# Patient Record
Sex: Female | Born: 1965 | Race: White | Hispanic: No | Smoking: Never smoker
Health system: Southern US, Community
[De-identification: ages and names within clinical notes are randomized; demographics above are authoritative.]

## PROBLEM LIST (undated history)

## (undated) DIAGNOSIS — I447 Left bundle-branch block, unspecified: Secondary | ICD-10-CM

## (undated) DIAGNOSIS — F32A Depression, unspecified: Secondary | ICD-10-CM

## (undated) DIAGNOSIS — M1990 Unspecified osteoarthritis, unspecified site: Secondary | ICD-10-CM

## (undated) DIAGNOSIS — F329 Major depressive disorder, single episode, unspecified: Secondary | ICD-10-CM

## (undated) DIAGNOSIS — Q120 Congenital cataract: Secondary | ICD-10-CM

## (undated) DIAGNOSIS — C801 Malignant (primary) neoplasm, unspecified: Secondary | ICD-10-CM

## (undated) DIAGNOSIS — R932 Abnormal findings on diagnostic imaging of liver and biliary tract: Secondary | ICD-10-CM

## (undated) DIAGNOSIS — H409 Unspecified glaucoma: Secondary | ICD-10-CM

## (undated) DIAGNOSIS — K2970 Gastritis, unspecified, without bleeding: Secondary | ICD-10-CM

---

## 2009-02-16 ENCOUNTER — Ambulatory Visit: Payer: Self-pay | Admitting: Family Medicine

## 2011-07-14 ENCOUNTER — Ambulatory Visit: Payer: Self-pay | Admitting: Family Medicine

## 2012-09-17 DIAGNOSIS — R4586 Emotional lability: Secondary | ICD-10-CM | POA: Insufficient documentation

## 2013-06-20 HISTORY — PX: COLONOSCOPY: SHX174

## 2013-10-23 DIAGNOSIS — F32A Depression, unspecified: Secondary | ICD-10-CM | POA: Insufficient documentation

## 2013-10-23 DIAGNOSIS — F329 Major depressive disorder, single episode, unspecified: Secondary | ICD-10-CM | POA: Insufficient documentation

## 2013-10-25 ENCOUNTER — Ambulatory Visit: Payer: Self-pay | Admitting: Internal Medicine

## 2013-12-24 ENCOUNTER — Ambulatory Visit: Payer: Self-pay | Admitting: Internal Medicine

## 2014-01-16 ENCOUNTER — Ambulatory Visit: Payer: Self-pay | Admitting: Gastroenterology

## 2014-01-24 LAB — PATHOLOGY REPORT

## 2014-01-30 ENCOUNTER — Ambulatory Visit: Payer: Self-pay | Admitting: Gastroenterology

## 2014-03-04 DIAGNOSIS — I447 Left bundle-branch block, unspecified: Secondary | ICD-10-CM | POA: Insufficient documentation

## 2014-03-27 DIAGNOSIS — R932 Abnormal findings on diagnostic imaging of liver and biliary tract: Secondary | ICD-10-CM | POA: Insufficient documentation

## 2014-07-10 ENCOUNTER — Ambulatory Visit: Payer: Self-pay | Admitting: Gastroenterology

## 2014-10-23 DIAGNOSIS — Q120 Congenital cataract: Secondary | ICD-10-CM | POA: Insufficient documentation

## 2014-10-23 DIAGNOSIS — H409 Unspecified glaucoma: Secondary | ICD-10-CM | POA: Insufficient documentation

## 2014-10-23 DIAGNOSIS — M1990 Unspecified osteoarthritis, unspecified site: Secondary | ICD-10-CM | POA: Insufficient documentation

## 2014-10-24 ENCOUNTER — Encounter: Payer: Self-pay | Admitting: *Deleted

## 2014-10-27 ENCOUNTER — Ambulatory Visit: Payer: BLUE CROSS/BLUE SHIELD | Admitting: Anesthesiology

## 2014-10-27 ENCOUNTER — Ambulatory Visit
Admission: RE | Admit: 2014-10-27 | Discharge: 2014-10-27 | Disposition: A | Discharge status: Home / Self Care | Payer: BLUE CROSS/BLUE SHIELD | Source: Ambulatory Visit | Attending: Gastroenterology | Admitting: Gastroenterology

## 2014-10-27 ENCOUNTER — Encounter
Admission: RE | Disposition: A | Discharge status: Home / Self Care | Payer: Self-pay | Source: Ambulatory Visit | Attending: Gastroenterology

## 2014-10-27 ENCOUNTER — Encounter: Payer: Self-pay | Admitting: *Deleted

## 2014-10-27 DIAGNOSIS — K2950 Unspecified chronic gastritis without bleeding: Secondary | ICD-10-CM | POA: Insufficient documentation

## 2014-10-27 DIAGNOSIS — Z79899 Other long term (current) drug therapy: Secondary | ICD-10-CM | POA: Diagnosis not present

## 2014-10-27 DIAGNOSIS — M1990 Unspecified osteoarthritis, unspecified site: Secondary | ICD-10-CM | POA: Diagnosis not present

## 2014-10-27 DIAGNOSIS — H409 Unspecified glaucoma: Secondary | ICD-10-CM | POA: Diagnosis not present

## 2014-10-27 DIAGNOSIS — K3189 Other diseases of stomach and duodenum: Secondary | ICD-10-CM | POA: Diagnosis not present

## 2014-10-27 DIAGNOSIS — R1011 Right upper quadrant pain: Secondary | ICD-10-CM | POA: Diagnosis present

## 2014-10-27 DIAGNOSIS — Z791 Long term (current) use of non-steroidal anti-inflammatories (NSAID): Secondary | ICD-10-CM | POA: Diagnosis not present

## 2014-10-27 HISTORY — PX: ESOPHAGOGASTRODUODENOSCOPY: SHX5428

## 2014-10-27 HISTORY — DX: Congenital cataract: Q12.0

## 2014-10-27 HISTORY — DX: Unspecified glaucoma: H40.9

## 2014-10-27 HISTORY — DX: Left bundle-branch block, unspecified: I44.7

## 2014-10-27 HISTORY — DX: Abnormal findings on diagnostic imaging of liver and biliary tract: R93.2

## 2014-10-27 HISTORY — DX: Unspecified osteoarthritis, unspecified site: M19.90

## 2014-10-27 HISTORY — DX: Depression, unspecified: F32.A

## 2014-10-27 HISTORY — DX: Major depressive disorder, single episode, unspecified: F32.9

## 2014-10-27 LAB — POCT PREGNANCY, URINE
Preg Test, Ur: NEGATIVE
Preg Test, Ur: NEGATIVE

## 2014-10-27 SURGERY — EGD (ESOPHAGOGASTRODUODENOSCOPY)
Anesthesia: General

## 2014-10-27 MED ORDER — MIDAZOLAM HCL 5 MG/5ML IJ SOLN
INTRAMUSCULAR | Status: DC | PRN
  Administered 2014-10-27: 2 mg via INTRAVENOUS

## 2014-10-27 MED ORDER — LACTATED RINGERS IV SOLN
INTRAVENOUS | Status: DC | PRN
  Administered 2014-10-27: 09:00:00 via INTRAVENOUS

## 2014-10-27 MED ORDER — SODIUM CHLORIDE 0.9 % IV SOLN
INTRAVENOUS | Status: DC
  Administered 2014-10-27: 09:00:00 via INTRAVENOUS

## 2014-10-27 MED ORDER — PROPOFOL 10 MG/ML IV BOLUS
INTRAVENOUS | Status: DC | PRN
  Administered 2014-10-27: 50 mg via INTRAVENOUS
  Administered 2014-10-27: 100 mg via INTRAVENOUS

## 2014-10-27 NOTE — H&P (Signed)
  Primary Care Physician:  Kirk Ruths., MD  Pre-Procedure History & Physical: HPI:  Grace Burnett is a 49 y.o. female is here for an endoscopy.   Past Medical History  Diagnosis Date  . Arthritis   . Glaucoma (increased eye pressure)   . Abnormal liver diagnostic imaging   . Depression   . LBBB (left bundle branch block)   . Congenital cataract     History reviewed. No pertinent past surgical history.  Prior to Admission medications   Medication Sig Start Date End Date Taking? Authorizing Provider  ibuprofen (ADVIL,MOTRIN) 200 MG tablet Take 400 mg by mouth daily as needed for headache (pain).   Yes Historical Provider, MD  latanoprost (XALATAN) 0.005 % ophthalmic solution Place 1 drop into both eyes at bedtime.  10/28/13  Yes Historical Provider, MD  rOPINIRole (REQUIP) 0.25 MG tablet Take 0.25 mg by mouth at bedtime. Take 1-3 hours before sleep 10/13/14 10/13/15 Yes Historical Provider, MD    Allergies as of 10/10/2014  . (Not on File)    History reviewed. No pertinent family history.  History   Social History  . Marital Status: Divorced    Spouse Name: N/A  . Number of Children: N/A  . Years of Education: N/A   Occupational History  . Not on file.   Social History Main Topics  . Smoking status: Not on file  . Smokeless tobacco: Not on file  . Alcohol Use: Not on file  . Drug Use: Not on file  . Sexual Activity: Not on file   Other Topics Concern  . Not on file   Social History Narrative     Physical Exam: BP 149/64 mmHg  Pulse 66  Temp(Src) 98.2 F (36.8 C) (Oral)  Resp 16  SpO2 100%  LMP  General:   Alert,  pleasant and cooperative in NAD Head:  Normocephalic and atraumatic. Neck:  Supple; no masses or thyromegaly. Lungs:  Clear throughout to auscultation.    Heart:  Regular rate and rhythm. Abdomen:  Soft, nontender and nondistended. Normal bowel sounds, without guarding, and without rebound.   Neurologic:  Alert and  oriented x4;   grossly normal neurologically.  Impression/Plan: Grace Burnett is here for an endoscopy to be performed for abdominal pain, possible portal HTN, r/o varices.   Risks, benefits, limitations, and alternatives regarding  endoscopy have been reviewed with the patient.  Questions have been answered.  All parties agreeable.   Josefine Class, MD  10/27/2014, 9:14 AM

## 2014-10-27 NOTE — Anesthesia Postprocedure Evaluation (Signed)
  Anesthesia Post-op Note  Patient: Grace Burnett  Procedure(s) Performed: Procedure(s): ESOPHAGOGASTRODUODENOSCOPY (EGD) (N/A)  Anesthesia type:General  Patient location: PACU  Post pain: Pain level controlled  Post assessment: Post-op Vital signs reviewed, Patient's Cardiovascular Status Stable, Respiratory Function Stable, Patent Airway and No signs of Nausea or vomiting  Post vital signs: Reviewed and stable  Last Vitals:  Filed Vitals:   10/27/14 0854  BP: 149/64  Pulse: 66  Temp: 36.8 C  Resp: 16    Level of consciousness: awake, alert  and patient cooperative  Complications: No apparent anesthesia complications

## 2014-10-27 NOTE — Op Note (Signed)
[facility] Patient Name: Grace Burnett Procedure Date: 10/27/2014 8:44 AM MRN: [id number] Account #: [id number] Date of Birth: 09-17-1965 Admit Type: Outpatient Age: 49 Room: [facility] ENDO ROOM 2 Gender: Female Note Status: Finalized Procedure:         Upper GI endoscopy Indications:       Abdominal pain in the right upper quadrant, possible                     portal hypertension: rule out esophageal varices Patient Profile:   This is a 49 year old female. Providers:         Gerrit Heck. Rayann Heman, MD Referring MD:      Ocie Cornfield. Ouida Sills, MD (Referring MD) Medicines:         Propofol per Anesthesia Complications:     No immediate complications. Procedure:         Pre-Anesthesia Assessment:                    - Prior to the procedure, a History and Physical was                     performed, and patient medications and allergies were                     reviewed. The patient is competent. The risks and benefits                     of the procedure and the sedation options and risks were                     discussed with the patient. All questions were answered                     and informed consent was obtained. Patient identification                     and proposed procedure were verified by the physician and                     the nurse in the pre-procedure area. Mental Status                     Examination: alert and oriented. Airway Examination:                     normal oropharyngeal airway and neck mobility. Respiratory                     Examination: clear to auscultation. CV Examination: RRR,                     no murmurs, no S3 or S4. Prophylactic Antibiotics: The                     patient does not require prophylactic antibiotics. Prior                     Anticoagulants: The patient has taken no previous                     anticoagulant or antiplatelet agents. ASA Grade                     Assessment: III - A patient  with severe  systemic disease.                     After reviewing the risks and benefits, the patient was                     deemed in satisfactory condition to undergo the procedure.                     The anesthesia plan was to use monitored anesthesia care                     (MAC). Immediately prior to administration of medications,                     the patient was re-assessed for adequacy to receive                     sedatives. The heart rate, respiratory rate, oxygen                     saturations, blood pressure, adequacy of pulmonary                     ventilation, and response to care were monitored                     throughout the procedure. The physical status of the                     patient was re-assessed after the procedure.                    - Prior to the procedure, a History and Physical was                     performed, and patient medications, allergies and                     sensitivities were reviewed. The patient's tolerance of                     previous anesthesia was reviewed.                    - Prior to the procedure, a History and Physical was                     performed, and patient medications, allergies and                     sensitivities were reviewed. The patient's tolerance of                     previous anesthesia was reviewed.                    After obtaining informed consent, the endoscope was passed                     under direct vision. Throughout the procedure, the                     patient's blood pressure, pulse, and oxygen saturations                     were monitored continuously. The Endoscope was introduced  through the mouth, and advanced to the second part of                     duodenum. The upper GI endoscopy was accomplished without                     difficulty. The patient tolerated the procedure well. Findings:      The esophagus was normal.      The stomach was normal.      The examined duodenum  was normal.      Four biopsies were obtained with cold forceps for histology randomly in       the gastric body and in the gastric antrum. Four biopsies were obtained       with cold forceps for histology randomly in the duodenal bulb and in the       2nd part of the duodenum. Impression:        - Normal esophagus.                    - Normal stomach.                    - Normal examined duodenum.                    - Four biopsies were obtained in the gastric body and in                     the gastric antrum.                    - Four biopsies were obtained in the duodenal bulb and in                     the 2nd part of the duodenum. Recommendation:    - Observe patient in GI recovery unit.                    - Resume regular diet.                    - Continue present medications.                    - Await pathology results.                    - Return to GI clinic.                    - This is likely abdominal migraine type pain. Consider                     starting SSRI or TCA.                    - The findings and recommendations were discussed with the                     patient.                    - The findings and recommendations were discussed with the                     patient's family. Procedure Code(s): --- Professional ---  54982, Esophagogastroduodenoscopy, flexible, transoral;                     with biopsy, single or multiple CPT copyright 2014 American Medical Association. All rights reserved. The codes documented in this report are preliminary and upon coder review may  be revised to meet current compliance requirements. Mellody Life, MD 10/27/2014 9:39:20 AM This report has been signed electronically. Number of Addenda: 0 Note Initiated On: 10/27/2014 8:44 AM Total Procedure Duration: 0 hours 7 minutes 10 seconds       [facility]

## 2014-10-27 NOTE — Anesthesia Preprocedure Evaluation (Signed)
Anesthesia Evaluation  Patient identified by MRN, date of birth, ID band Patient awake    Reviewed: Allergy & Precautions, H&P , NPO status , Patient's Chart, lab work & pertinent test results, reviewed documented beta blocker date and time   Airway Mallampati: II  TM Distance: >3 FB Neck ROM: full    Dental no notable dental hx.    Pulmonary neg pulmonary ROS,  breath sounds clear to auscultation  Pulmonary exam normal       Cardiovascular Exercise Tolerance: Good negative cardio ROS  Rhythm:regular Rate:Normal     Neuro/Psych negative neurological ROS  negative psych ROS   GI/Hepatic negative GI ROS, Neg liver ROS,   Endo/Other  negative endocrine ROS  Renal/GU negative Renal ROS  negative genitourinary   Musculoskeletal   Abdominal   Peds  Hematology negative hematology ROS (+)   Anesthesia Other Findings   Reproductive/Obstetrics negative OB ROS                             Anesthesia Physical Anesthesia Plan  ASA: II  Anesthesia Plan: General   Post-op Pain Management:    Induction:   Airway Management Planned:   Additional Equipment:   Intra-op Plan:   Post-operative Plan:   Informed Consent: I have reviewed the patients History and Physical, chart, labs and discussed the procedure including the risks, benefits and alternatives for the proposed anesthesia with the patient or authorized representative who has indicated his/her understanding and acceptance.   Dental Advisory Given  Plan Discussed with: CRNA  Anesthesia Plan Comments:         Anesthesia Quick Evaluation

## 2014-10-27 NOTE — Discharge Instructions (Signed)

## 2014-10-27 NOTE — Transfer of Care (Signed)
Immediate Anesthesia Transfer of Care Note  Patient: Grace Burnett  Procedure(s) Performed: Procedure(s): ESOPHAGOGASTRODUODENOSCOPY (EGD) (N/A)  Patient Location: PACU  Anesthesia Type:General  Level of Consciousness: alert  and oriented  Airway & Oxygen Therapy: Patient Spontanous Breathing  Post-op Assessment: Report given to RN  Post vital signs: Reviewed and stable  Last Vitals:  Filed Vitals:   10/27/14 0854  BP: 149/64  Pulse: 66  Temp: 36.8 C  Resp: 16    Complications: No apparent anesthesia complications

## 2014-10-29 ENCOUNTER — Encounter: Payer: Self-pay | Admitting: Gastroenterology

## 2014-10-29 LAB — SURGICAL PATHOLOGY

## 2015-01-06 ENCOUNTER — Other Ambulatory Visit: Payer: Self-pay | Admitting: Internal Medicine

## 2015-01-06 DIAGNOSIS — N6452 Nipple discharge: Secondary | ICD-10-CM

## 2015-01-06 DIAGNOSIS — N644 Mastodynia: Secondary | ICD-10-CM

## 2015-01-09 ENCOUNTER — Encounter: Payer: Self-pay | Admitting: *Deleted

## 2015-01-20 ENCOUNTER — Ambulatory Visit
Admission: RE | Admit: 2015-01-20 | Discharge: 2015-01-20 | Disposition: A | Discharge status: Home / Self Care | Payer: BLUE CROSS/BLUE SHIELD | Source: Ambulatory Visit | Attending: Internal Medicine | Admitting: Internal Medicine

## 2015-01-20 ENCOUNTER — Ambulatory Visit: Payer: BLUE CROSS/BLUE SHIELD

## 2015-01-20 ENCOUNTER — Other Ambulatory Visit: Payer: Self-pay | Admitting: Internal Medicine

## 2015-01-20 DIAGNOSIS — N6452 Nipple discharge: Secondary | ICD-10-CM

## 2015-01-20 DIAGNOSIS — N644 Mastodynia: Secondary | ICD-10-CM

## 2015-01-20 HISTORY — DX: Malignant (primary) neoplasm, unspecified: C80.1

## 2015-01-29 ENCOUNTER — Ambulatory Visit (INDEPENDENT_AMBULATORY_CARE_PROVIDER_SITE_OTHER): Payer: BLUE CROSS/BLUE SHIELD | Admitting: General Surgery

## 2015-01-29 ENCOUNTER — Encounter: Payer: Self-pay | Admitting: General Surgery

## 2015-01-29 VITALS — BP 122/74 | HR 76 | Resp 12 | Ht 59.0 in | Wt 124.0 lb

## 2015-01-29 DIAGNOSIS — N6452 Nipple discharge: Secondary | ICD-10-CM | POA: Diagnosis not present

## 2015-01-29 NOTE — Progress Notes (Signed)
Patient ID: Grace Burnett, female   DOB: [birth year], 49 y.o.   MRN: [id number]  Chief Complaint  Patient presents with  . Other    bloody discharge     HPI Grace Burnett is a 49 y.o. female who presents for a breast evaluation. The most recent mammogram was done on 8/2/016.  Patient does perform regular self breast checks and gets regular mammograms done.  Patient states she noticed some bloody discharge from her right nipple about first part of June. She has not noticed any discharge since that one time. Denies any breast injury. She states since the nipple discharge the right breast has been "sore".  The patient denies any trauma to the area. She does work in a [employer], and his right arm dominant area  She had seen Dr Rayann Heman for right upper abdominal pain in the past.  The patient is a mother of 28, twin girls 44, and a 42 and 47 year old.  HPI  Past Medical History  Diagnosis Date  . Arthritis   . Glaucoma (increased eye pressure)   . Abnormal liver diagnostic imaging   . Depression   . LBBB (left bundle branch block)   . Congenital cataract   . Cancer     skin    Past Surgical History  Procedure Laterality Date  . Esophagogastroduodenoscopy N/A 10/27/2014    Procedure: ESOPHAGOGASTRODUODENOSCOPY (EGD);  Surgeon: Josefine Class, MD;  [facility] ENDOSCOPY;  Service: Endoscopy;  Laterality: N/A;  . Colonoscopy  2015    Dr Rayann Heman    Family History  Problem Relation Age of Onset  . Breast cancer      great aunt age 41-40?    Social History Social History  Substance Use Topics  . Smoking status: Never Smoker   . Smokeless tobacco: Never Used  . Alcohol Use: No    Allergies  Allergen Reactions  . Sulfa Antibiotics Nausea And Vomiting and Other (See Comments)    Severe vomiting, possibly caused fever also    Current Outpatient Prescriptions  Medication Sig Dispense Refill  . ibuprofen (ADVIL,MOTRIN) 200 MG tablet Take 400 mg by mouth daily  as needed for headache (pain).    Marland Kitchen latanoprost (XALATAN) 0.005 % ophthalmic solution Place 1 drop into both eyes at bedtime.     Marland Kitchen rOPINIRole (REQUIP) 0.25 MG tablet Take 0.25 mg by mouth at bedtime. Take 1-3 hours before sleep     No current facility-administered medications for this visit.    Review of Systems Review of Systems  Constitutional: Negative.   Respiratory: Negative.   Cardiovascular: Negative.     Blood pressure 122/74, pulse 76, resp. rate 12, height 4\' 11"  (1.499 m), weight 124 lb (56.246 kg), last menstrual period 01/01/2015.  Physical Exam Physical Exam  Constitutional: She is oriented to person, place, and time. She appears well-developed and well-nourished.  HENT:  Mouth/Throat: Oropharynx is clear and moist.  Eyes: Conjunctivae are normal. No scleral icterus.  Neck: Neck supple.  Cardiovascular: Normal rate, regular rhythm and normal heart sounds.   Pulmonary/Chest: Effort normal and breath sounds normal. Right breast exhibits no inverted nipple, no mass, no nipple discharge, no skin change and no tenderness. Left breast exhibits no inverted nipple, no mass, no nipple discharge, no skin change and no tenderness.    Lymphadenopathy:    She has no cervical adenopathy.    She has no axillary adenopathy.  Neurological: She is alert and oriented to person, place, and time.  Skin: Skin is warm and dry.  Psychiatric: Her behavior is normal.    Data Reviewed Bilateral mammograms and right breast ultrasound of 01/20/2015 reviewed. Nipple drainage was not able to be identified at that time. Small cyst in the lateral aspect of the retroareolar area of the right breast were reported. BI-RADS-2.  Independent review the films shows a small focal density 2.8 mm in diameter superficially located in the retroareolar area at the 3:00 position. This is smoothly marginated.  Assessment    Isolated episode of bloody nipple drainage.    Plan    The mammograms except  for the small less than 3 mm area in the retroareolar area of the medial right breasts are unremarkable compared 2015. The diffuse right-sided breast tenderness is is likely to be from the underlying pectoralis muscle as the breast itself.  The patient has been encouraged to call this office directly should she develop a recurrent episode drainage. If that occurs, we'll repeat her ultrasound and determine if weeks section of the retroareolar ductal structures is necessary. If she remains asymptomatic in regards to nipple drainage annual screening mammograms with her PCP are reasonable.     Follow up as needed or if breast symptoms occur. Continue self breast exams. Call office for any new breast issues or concerns.  PCP:  Sol Passer 01/29/2015, 11:57 AM

## 2015-01-29 NOTE — Patient Instructions (Addendum)
Follow up as needed ir if breast symptoms occur Continue self breast exams. Call office for any new breast issues or concerns.

## 2016-02-17 ENCOUNTER — Other Ambulatory Visit: Payer: Self-pay | Admitting: Internal Medicine

## 2016-02-17 DIAGNOSIS — Z1231 Encounter for screening mammogram for malignant neoplasm of breast: Secondary | ICD-10-CM

## 2016-03-04 ENCOUNTER — Other Ambulatory Visit: Payer: Self-pay | Admitting: Internal Medicine

## 2016-03-04 ENCOUNTER — Ambulatory Visit
Admission: RE | Admit: 2016-03-04 | Discharge: 2016-03-04 | Disposition: A | Discharge status: Home / Self Care | Payer: BLUE CROSS/BLUE SHIELD | Source: Ambulatory Visit | Attending: Internal Medicine | Admitting: Internal Medicine

## 2016-03-04 DIAGNOSIS — Z1231 Encounter for screening mammogram for malignant neoplasm of breast: Secondary | ICD-10-CM | POA: Insufficient documentation

## 2016-04-25 ENCOUNTER — Encounter: Payer: Self-pay | Admitting: Emergency Medicine

## 2016-04-25 ENCOUNTER — Emergency Department
Admission: EM | Admit: 2016-04-25 | Discharge: 2016-04-25 | Disposition: A | Discharge status: Home / Self Care | Payer: BLUE CROSS/BLUE SHIELD | Attending: Emergency Medicine | Admitting: Emergency Medicine

## 2016-04-25 ENCOUNTER — Emergency Department: Payer: BLUE CROSS/BLUE SHIELD

## 2016-04-25 DIAGNOSIS — R1084 Generalized abdominal pain: Secondary | ICD-10-CM | POA: Diagnosis present

## 2016-04-25 DIAGNOSIS — R112 Nausea with vomiting, unspecified: Secondary | ICD-10-CM | POA: Insufficient documentation

## 2016-04-25 DIAGNOSIS — R202 Paresthesia of skin: Secondary | ICD-10-CM | POA: Insufficient documentation

## 2016-04-25 DIAGNOSIS — Z859 Personal history of malignant neoplasm, unspecified: Secondary | ICD-10-CM | POA: Insufficient documentation

## 2016-04-25 DIAGNOSIS — Z79899 Other long term (current) drug therapy: Secondary | ICD-10-CM | POA: Diagnosis not present

## 2016-04-25 DIAGNOSIS — R1033 Periumbilical pain: Secondary | ICD-10-CM | POA: Diagnosis not present

## 2016-04-25 DIAGNOSIS — R0602 Shortness of breath: Secondary | ICD-10-CM | POA: Insufficient documentation

## 2016-04-25 HISTORY — DX: Gastritis, unspecified, without bleeding: K29.70

## 2016-04-25 LAB — CBC
HEMATOCRIT: 43.6 % (ref 35.0–47.0)
Hemoglobin: 15.4 g/dL (ref 12.0–16.0)
MCH: 31.5 pg (ref 26.0–34.0)
MCHC: 35.3 g/dL (ref 32.0–36.0)
MCV: 89.3 fL (ref 80.0–100.0)
PLATELETS: 236 10*3/uL (ref 150–440)
RBC: 4.88 MIL/uL (ref 3.80–5.20)
RDW: 12.9 % (ref 11.5–14.5)
WBC: 8.4 10*3/uL (ref 3.6–11.0)

## 2016-04-25 LAB — COMPREHENSIVE METABOLIC PANEL
ALT: 22 U/L (ref 14–54)
AST: 33 U/L (ref 15–41)
Albumin: 4.5 g/dL (ref 3.5–5.0)
Alkaline Phosphatase: 65 U/L (ref 38–126)
Anion gap: 10 (ref 5–15)
BUN: 9 mg/dL (ref 6–20)
CHLORIDE: 104 mmol/L (ref 101–111)
CO2: 23 mmol/L (ref 22–32)
CREATININE: 0.56 mg/dL (ref 0.44–1.00)
Calcium: 9.5 mg/dL (ref 8.9–10.3)
Glucose, Bld: 132 mg/dL — ABNORMAL HIGH (ref 65–99)
POTASSIUM: 2.9 mmol/L — AB (ref 3.5–5.1)
SODIUM: 137 mmol/L (ref 135–145)
Total Bilirubin: 1 mg/dL (ref 0.3–1.2)
Total Protein: 7.5 g/dL (ref 6.5–8.1)

## 2016-04-25 LAB — URINALYSIS COMPLETE WITH MICROSCOPIC (ARMC ONLY)
BACTERIA UA: NONE SEEN
Bilirubin Urine: NEGATIVE
Glucose, UA: NEGATIVE mg/dL
HGB URINE DIPSTICK: NEGATIVE
LEUKOCYTES UA: NEGATIVE
Nitrite: NEGATIVE
PH: 9 — AB (ref 5.0–8.0)
PROTEIN: 30 mg/dL — AB
RBC / HPF: NONE SEEN RBC/hpf (ref 0–5)
Specific Gravity, Urine: 1.015 (ref 1.005–1.030)

## 2016-04-25 LAB — TROPONIN I

## 2016-04-25 LAB — POCT PREGNANCY, URINE: PREG TEST UR: NEGATIVE

## 2016-04-25 LAB — LIPASE, BLOOD: Lipase: 29 U/L (ref 11–51)

## 2016-04-25 MED ORDER — ONDANSETRON HCL 4 MG/2ML IJ SOLN
4.0000 mg | Freq: Once | INTRAMUSCULAR | Status: AC
  Administered 2016-04-25: 4 mg via INTRAVENOUS
  Filled 2016-04-25: qty 2

## 2016-04-25 MED ORDER — LORAZEPAM 2 MG/ML IJ SOLN
1.0000 mg | Freq: Once | INTRAMUSCULAR | Status: AC
  Administered 2016-04-25: 1 mg via INTRAVENOUS
  Filled 2016-04-25: qty 1

## 2016-04-25 MED ORDER — IOPAMIDOL (ISOVUE-300) INJECTION 61%
100.0000 mL | Freq: Once | INTRAVENOUS | Status: AC | PRN
Start: 2016-04-25 — End: 2016-04-25
  Administered 2016-04-25: 100 mL via INTRAVENOUS

## 2016-04-25 MED ORDER — ONDANSETRON HCL 4 MG PO TABS: 4.0000 mg | ORAL_TABLET | Freq: Three times a day (TID) | ORAL | 0 refills | Status: AC | PRN

## 2016-04-25 MED ORDER — IOPAMIDOL (ISOVUE-300) INJECTION 61%
30.0000 mL | Freq: Once | INTRAVENOUS | Status: AC | PRN
  Administered 2016-04-25: 30 mL via ORAL

## 2016-04-25 MED ORDER — POTASSIUM CHLORIDE CRYS ER 20 MEQ PO TBCR
20.0000 meq | EXTENDED_RELEASE_TABLET | Freq: Once | ORAL | Status: AC
  Administered 2016-04-25: 20 meq via ORAL
  Filled 2016-04-25: qty 1

## 2016-04-25 MED ORDER — HYDROCODONE-ACETAMINOPHEN 5-325 MG PO TABS
1.0000 | ORAL_TABLET | Freq: Once | ORAL | Status: AC
  Administered 2016-04-25: 1 via ORAL
  Filled 2016-04-25: qty 1

## 2016-04-25 NOTE — ED Notes (Signed)
At present pt states lower abd pain that radiates to the right side of her back, pt states numbness has resolved at this time, states occasional SOB with the pain starts, pt awake and alert, family at bedside

## 2016-04-25 NOTE — ED Notes (Signed)
EDP at bedside  

## 2016-04-25 NOTE — ED Notes (Signed)
Pt resting in bed, CT aware pt has finished contrast, family at bedside, pt awake and alert in no acute distress

## 2016-04-25 NOTE — Discharge Instructions (Signed)
Please take over-the-counter Tylenol as your liver function appears to be doing well. Please contact her primary physician and/or the gastroenterologist for further assessment of your abdomen pain.

## 2016-04-25 NOTE — ED Notes (Signed)
Patient transported to CT 

## 2016-04-25 NOTE — ED Triage Notes (Signed)
Patient presents to the ED with abdominal pain, shortness of breath, and vomiting x 3 that began approx. 1 hour prior to arrival in ED.  Patient is complaining of pelvic pain, epigastric pain and right upper quadrant pain that began after patient ate lunch and took diclofenac.  Patient reports similar episodes previously, last episode was 2 months ago.  Patient denies seeing a provider for previous episodes.  Patient appears uncomfortable in triage.  Patient states this episode she is having intermittent left arm numbness and tingling.  Patient denies chest pain, denies diarrhea.  Reports feeling dizzy and tired.  Patient states previously she would lay down until the feeling passed.

## 2016-04-25 NOTE — ED Provider Notes (Signed)
Time Seen: Approximately 1631  I have reviewed the triage notes  Chief Complaint: Abdominal Pain   History of Present Illness: Grace Burnett is a 50 y.o. female who presents with diffuse abdominal pain that started earlier today. Patient states approximately one hour prior to arrival which would be around 3:30 PM she noticed some diffuse abdominal pain that she noticed after lunch. She took a Bentyl which did not offer any relief. She's had some similar discomfort before with no obvious etiology. She states she has had a previous appendectomy but no other abdominal surgeries outside of C-sections. Patient reports feelings of lightheadedness and generalized fatigue and had some numbness and tingling especially in her upper extremities. She denies any chest pain. She denies any fever, chills or productive cough. She denies any obvious pleuritic or positional component to her discomfort. He is primarily stating that start in the lower middle quadrant and made its way up toward the periumbilical area and toward the right flank region.  Past Medical History:  Diagnosis Date  . Abnormal liver diagnostic imaging   . Arthritis   . Cancer ([place])    skin  . Congenital cataract   . Depression   . Gastritis   . Glaucoma (increased eye pressure)   . LBBB (left bundle branch block)     Patient Active Problem List   Diagnosis Date Noted  . Bloody discharge from nipple 01/29/2015  . Arthritis 10/23/2014  . Cataract, congenital 10/23/2014  . Glaucoma 10/23/2014  . Nonvisualization of gallbladder 03/27/2014  . Block, bundle branch, left 03/04/2014  . Clinical depression 10/23/2013  . Mood altered ([place]) 09/17/2012    Past Surgical History:  Procedure Laterality Date  . COLONOSCOPY  2015   Dr Rayann Heman  . ESOPHAGOGASTRODUODENOSCOPY N/A 10/27/2014   Procedure: ESOPHAGOGASTRODUODENOSCOPY (EGD);  Surgeon: Josefine Class, MD;  Location[facility] ENDOSCOPY;  Service: Endoscopy;  Laterality: N/A;     Past Surgical History:  Procedure Laterality Date  . COLONOSCOPY  2015   Dr Rayann Heman  . ESOPHAGOGASTRODUODENOSCOPY N/A 10/27/2014   Procedure: ESOPHAGOGASTRODUODENOSCOPY (EGD);  Surgeon: Josefine Class, MD;  Location: [facility] ENDOSCOPY;  Service: Endoscopy;  Laterality: N/A;    Current Outpatient Rx  . Order #: [id number] Class: Historical Med  . Order #: [id number] Class: Historical Med  . Order #: [id number] Class: Historical Med  . Order #: [id number] Class: Historical Med    Allergies:  Sulfa antibiotics  Family History: Family History  Problem Relation Age of Onset  . Breast cancer      great aunt age 59-40?    Social History: Social History  Substance Use Topics  . Smoking status: Never Smoker  . Smokeless tobacco: Never Used  . Alcohol use No     Review of Systems:   10 point review of systems was performed and was otherwise negative:  Constitutional: No fever Eyes: No visual disturbances ENT: No sore throat, ear pain Cardiac: No chest pain Respiratory: Shortness of breath was very transient and associated with numbness and tingling. Abdomen: Diffuse abdominal pain associated with some nausea and vomited 3 with no blood no bile. States a normal bowel movement this morning. Endocrine: No weight loss, No night sweats Extremities: No peripheral edema, cyanosis Skin: No rashes, easy bruising Neurologic: No focal weakness, trouble with speech or swollowing Urologic: No dysuria, Hematuria, or urinary frequency She denies any vaginal discharge or bleeding.  Physical Exam:  ED Triage Vitals  Enc Vitals Group     BP 04/25/16 1518 (!) 145/78  Pulse Rate 04/25/16 1518 68     Resp 04/25/16 1518 18     Temp 04/25/16 1518 98 F (36.7 C)     Temp Source 04/25/16 1518 Oral     SpO2 04/25/16 1518 100 %     Weight 04/25/16 1519 123 lb (55.8 kg)     Height 04/25/16 1519 5' (1.524 m)     Head Circumference --      Peak Flow --      Pain Score 04/25/16 1519 9      Pain Loc --      Pain Edu? --      Excl. in [place]? --     General: Awake , Alert , and Oriented times 3; GCS 15 Very anxious Head: Normal cephalic , atraumatic Eyes: Pupils equal , round, reactive to light Nose/Throat: No nasal drainage, patent upper airway without erythema or exudate.  Neck: Supple, Full range of motion, No anterior adenopathy or palpable thyroid masses Lungs: Clear to ascultation without wheezes , rhonchi, or rales Heart: Regular rate, regular rhythm without murmurs , gallops , or rubs Abdomen: Mild tenderness in the paraumbilical area without any obvious peritoneal signs. No rebound no guarding no rigidity. Negative Murphy's sign.        Extremities: 2 plus symmetric pulses. No edema, clubbing or cyanosis Neurologic: normal ambulation, Motor symmetric without deficits, sensory intact Skin: warm, dry, no rashes   Labs:   All laboratory work was reviewed including any pertinent negatives or positives listed below:  Labs Reviewed  COMPREHENSIVE METABOLIC PANEL - Abnormal; Notable for the following:       Result Value   Potassium 2.9 (*)    Glucose, Bld 132 (*)    All other components within normal limits  LIPASE, BLOOD  CBC  URINALYSIS COMPLETEWITH MICROSCOPIC ([facility] ONLY)  TROPONIN I  POC URINE PREG, ED  CBG MONITORING, ED    EKG: * ED ECG REPORT I, Daymon Larsen, the attending physician, personally viewed and interpreted this ECG.  Date: 04/25/2016 EKG Time:1517Rate: 65Rhythm: normal sinus rhythm QRS Axis: normal Intervals: normal ST/T Wave abnormalities: normal Conduction Disturbances: none Narrative Interpretation: unremarkable Old left bundle branch block pattern No acute ischemic changes noted   Radiology: *  "Dg Chest 2 View  Result Date: 04/25/2016 CLINICAL DATA:  50 y/o F; sudden onset shortness of breath and difficulty breathing with epigastric pain. EXAM: CHEST  2 VIEW COMPARISON:  None. FINDINGS: The heart size and mediastinal  contours are within normal limits. Both lungs are clear. Mild levocurvature and endplate degenerative changes of the thoracic spine. IMPRESSION: No active cardiopulmonary disease. Electronically Signed   By: Kristine Garbe M.D.   On: 04/25/2016 16:00   Ct Abdomen Pelvis W Contrast  Result Date: 04/25/2016 CLINICAL DATA:  Epigastric and back pain with nausea and vomiting since 2:30 today. EXAM: CT ABDOMEN AND PELVIS WITH CONTRAST TECHNIQUE: Multidetector CT imaging of the abdomen and pelvis was performed using the standard protocol following bolus administration of intravenous contrast. CONTRAST:  100 ml ISOVUE-300 IOPAMIDOL (ISOVUE-300) INJECTION 61% COMPARISON:  CT abdomen and pelvis 10/25/2013. FINDINGS: Lower chest: Heart size is normal. No pleural or pericardial effusion. Dependent atelectatic change in the lung bases is noted. 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 No focal liver lesion is identified. The gallbladder is largely decompressed but otherwise unremarkable. The splenic and portal veins are patent. Pancreas: Unremarkable. Spleen: Upper limits of normal in size at 13 cm craniocaudal.  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 Otherwise unremarkable. Stomach/Bowel: The appendix has been removed. Otherwise unremarkable. Vascular/Lymphatic: Unremarkable. Reproductive: Simple appearing cyst in the right ovary measures 2.7 cm AP by 2.2 cm craniocaudal by 2.2 cm transverse and is most consistent with a functional cyst. No further imaging is recommended. Otherwise unremarkable. Other: Small volume of perihepatic fluid is noted. Musculoskeletal: Unremarkable. IMPRESSION: No acute abnormality. Cirrhotic appearing liver with associated small volume of perihepatic ascites and spleen at the upper limits of normal in size.  Electronically Signed   By: Inge Rise M.D.   On: 04/25/2016 18:41  "   I personally reviewed the radiologic studies     ED Course:  Patient's stay here was uneventful and she had symptomatic relief with Ativan patient also had Zofran which controlled her nausea. Patient's source for abdominal pain is unknown. She does have some what appears to be oral findings on her CAT scan. No obvious acute surgical etiology at this time and she was advised follow-up with her primary physician and/or the gastroenterologist. He is advised continue with the Bentyl and I prescribed her Zofran for nausea and vomiting if that occurs. Clinical Course      Assessment:  Acute unspecified abdominal pain      Plan:  Outpatient " New Prescriptions   ONDANSETRON (ZOFRAN) 4 MG TABLET    Take 1 tablet (4 mg total) by mouth every 8 (eight) hours as needed for nausea or vomiting.  "  Patient was advised to return immediately if condition worsens. Patient was advised to follow up with their primary care physician or other specialized physicians involved in their outpatient care. The patient and/or family member/power of attorney had laboratory results reviewed at the bedside. All questions and concerns were addressed and appropriate discharge instructions were distributed by the nursing staff.             Daymon Larsen, MD 04/25/16 [phone]

## 2016-04-25 NOTE — ED Notes (Signed)
Pt returned from CT, resting in bed in no distress 

## 2017-10-07 IMAGING — CT CT ABD-PELV W/ CM
2 of 5 series · 15 of 46 positions shown, 17 images · IV contrast (APPLIED)
Comparison: CT abdomen and pelvis 10/25/2013.

CLINICAL DATA: Epigastric and back pain with nausea and vomiting
since [DATE] today.

EXAM:
CT ABDOMEN AND PELVIS WITH CONTRAST
TECHNIQUE: Multidetector CT imaging of the abdomen and pelvis was performed
using the standard protocol following bolus administration of
intravenous contrast.
CONTRAST:  100 ml 51XEJZ-IEE IOPAMIDOL (51XEJZ-IEE) INJECTION 61%

[Series 2: axial st · axial · 0.70mm/px · z∈[-349,+66]mm · 12 of 93 slices shown, 14 images]
[im 5/93  soft-tissue]
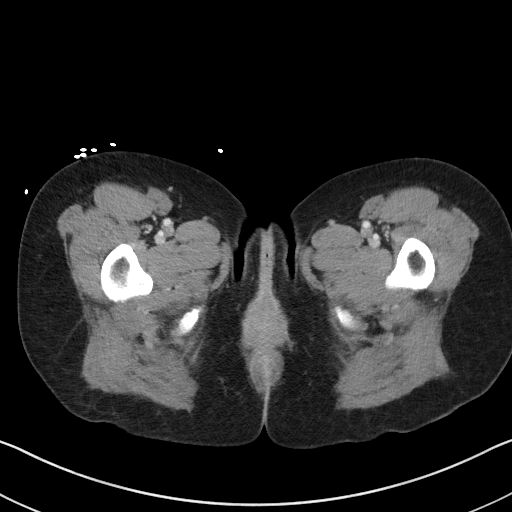
[im 5/93  bone]
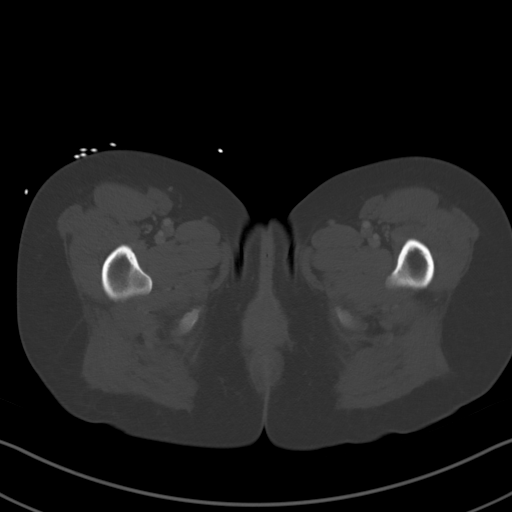
[im 15/93  soft-tissue]
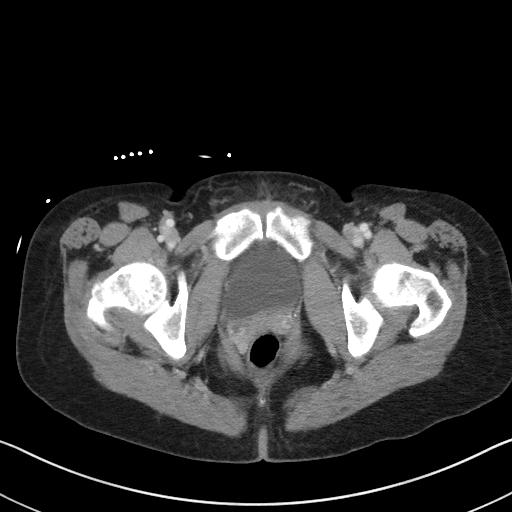
[im 20/93  soft-tissue]
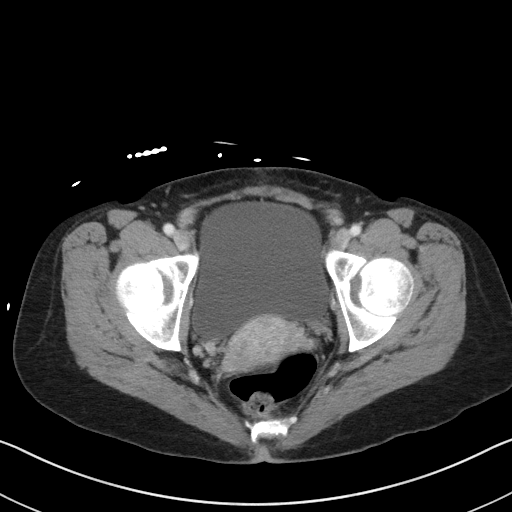
[im 30/93  soft-tissue]
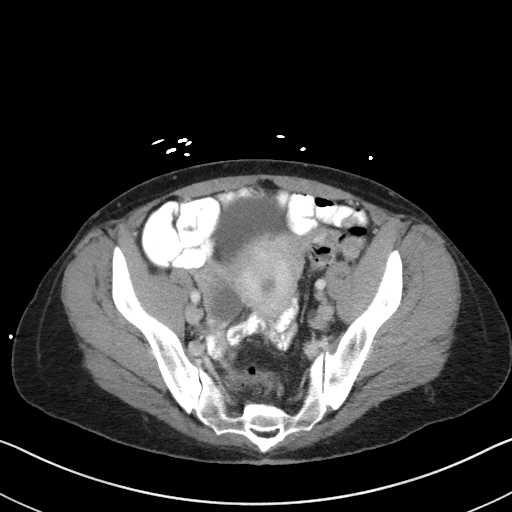
[im 34/93  soft-tissue]
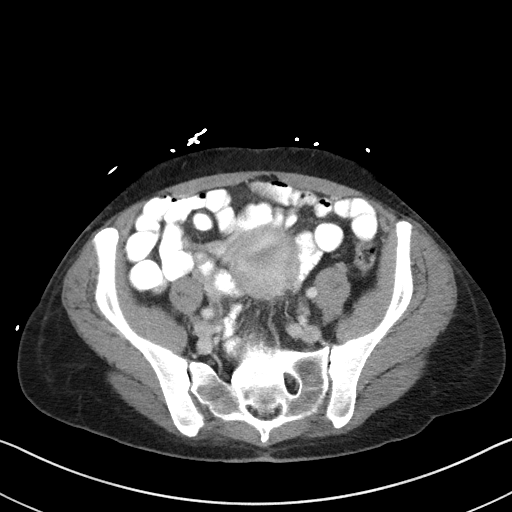
[im 44/93  soft-tissue]
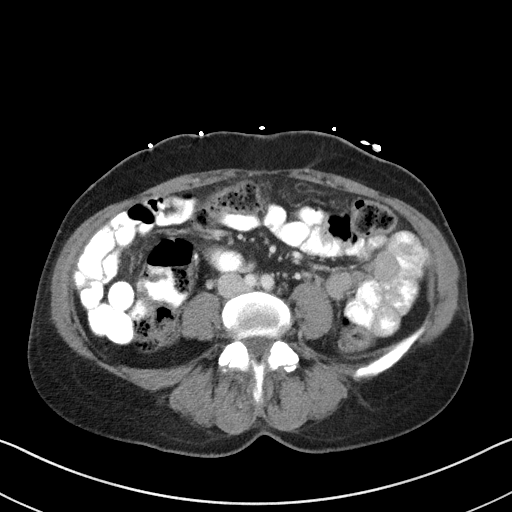
[im 49/93  soft-tissue]
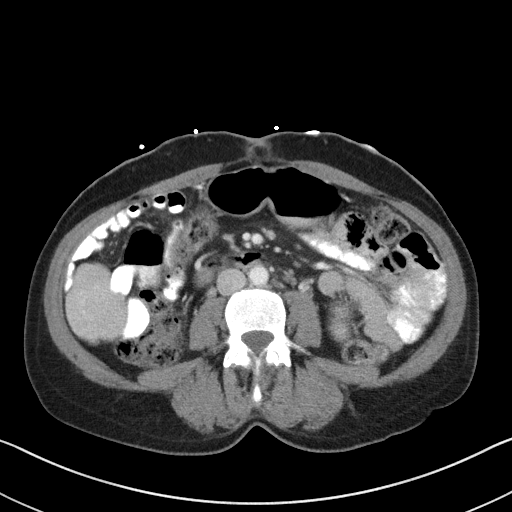
[im 59/93  soft-tissue]
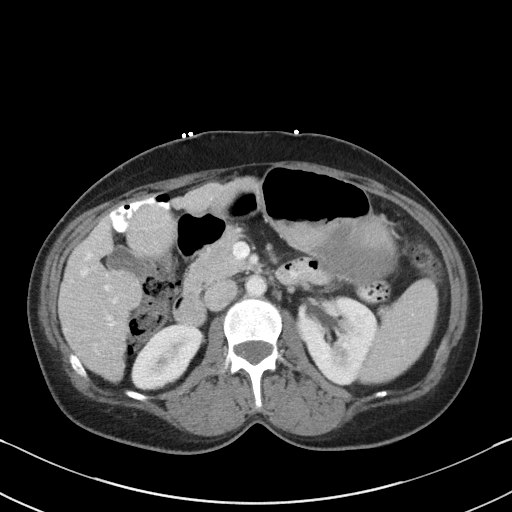
[im 63/93  soft-tissue]
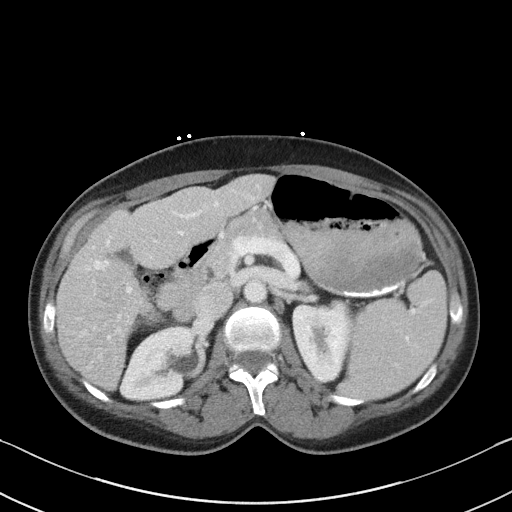
[im 63/93  bone]
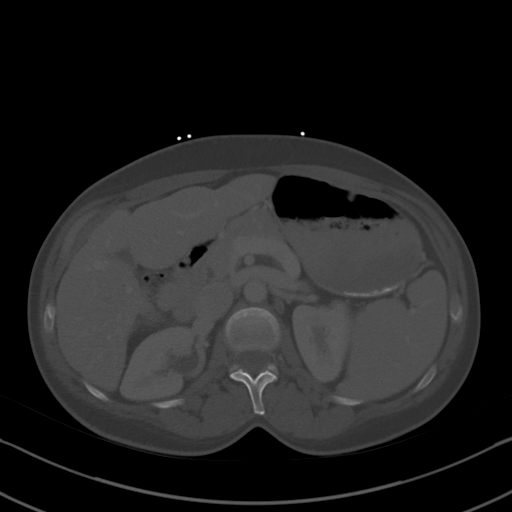
[im 73/93  soft-tissue]
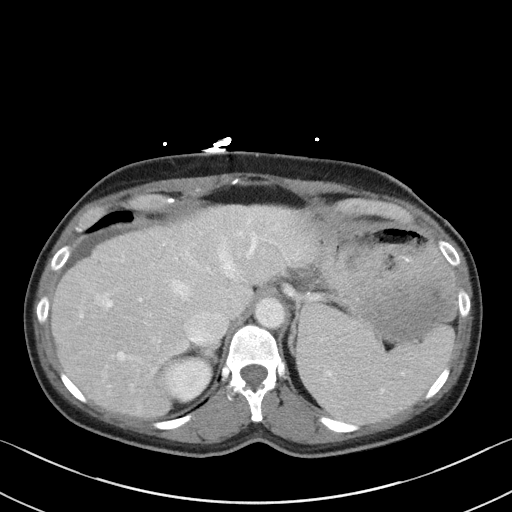
[im 78/93  soft-tissue]
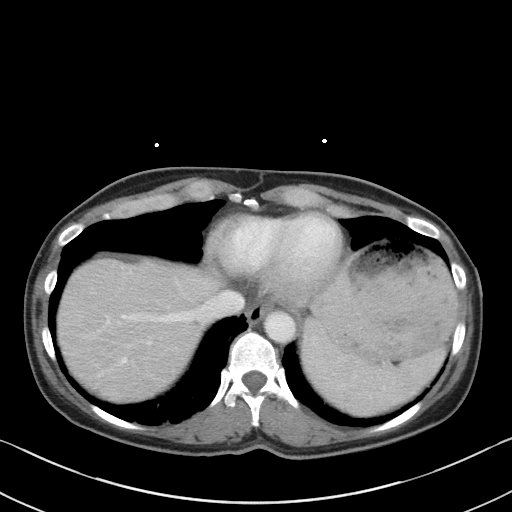
[im 88/93  soft-tissue]
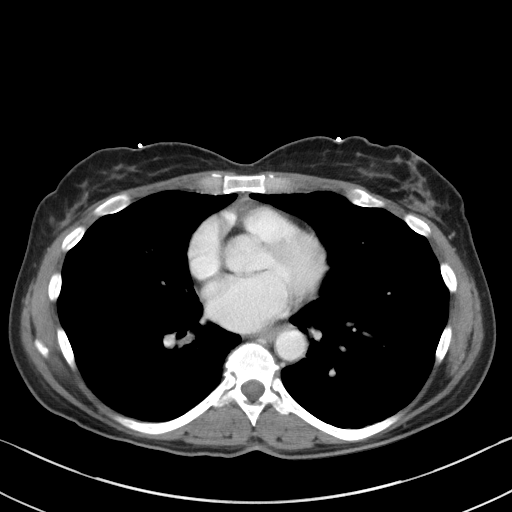

[Series 5: coronal st · coronal · 0.59mm/px · 3 of 76 slices shown]
[im 26/76  soft-tissue]
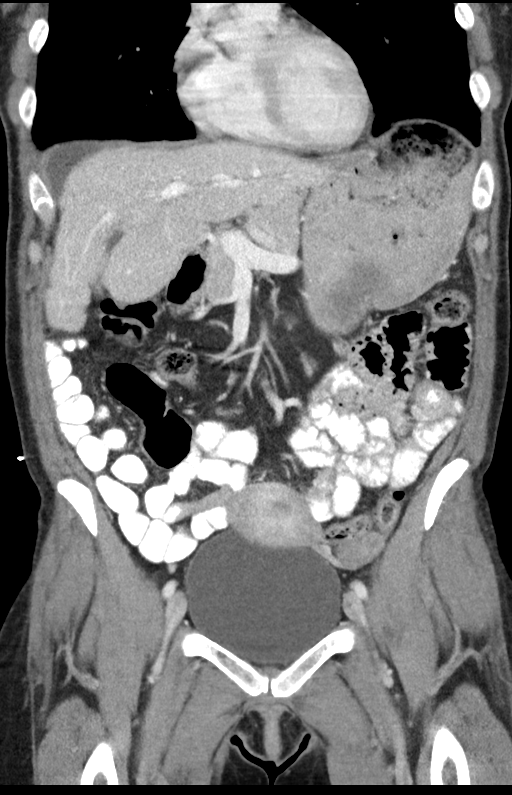
[im 34/76  soft-tissue]
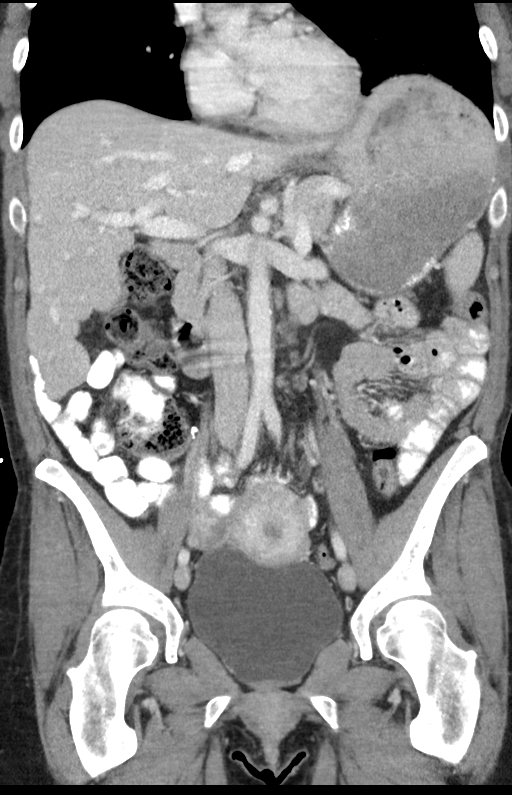
[im 42/76  soft-tissue]
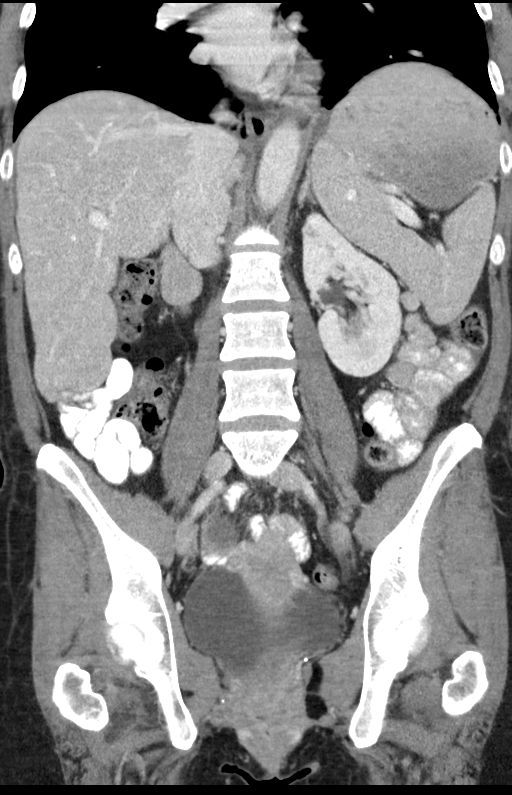

[15 of 46 positions shown; findings below may reference images not displayed]

FINDINGS: Lower chest: Heart size is normal. No pleural or pericardial
effusion. Dependent atelectatic change in the lung bases is noted.

Hepatobiliary: Nodularity of the liver border compatible with
macronodular cirrhosis is unchanged. No focal liver lesion is
identified. The gallbladder is largely decompressed but otherwise
unremarkable. The splenic and portal veins are patent.

Pancreas: Unremarkable.

Spleen: Upper limits of normal in size at 13 cm craniocaudal.

Adrenals/Urinary Tract: 0.6 cm in diameter fat attenuating lesion in
the lower pole of the right kidney consistent with an angiomyolipoma
is incidentally noted. Otherwise unremarkable.

Stomach/Bowel: The appendix has been removed. Otherwise
unremarkable.

Vascular/Lymphatic: Unremarkable.

Reproductive: Simple appearing cyst in the right ovary measures
cm AP by 2.2 cm craniocaudal by 2.2 cm transverse and is most
consistent with a functional cyst. No further imaging is
recommended. Otherwise unremarkable.

Other: Small volume of perihepatic fluid is noted.

Musculoskeletal: Unremarkable.
IMPRESSION: No acute abnormality.

Cirrhotic appearing liver with associated small volume of
perihepatic ascites and spleen at the upper limits of normal in
size.

## 2018-11-11 ENCOUNTER — Emergency Department: Payer: BLUE CROSS/BLUE SHIELD

## 2018-11-11 ENCOUNTER — Emergency Department
Admission: EM | Admit: 2018-11-11 | Discharge: 2018-11-11 | Disposition: A | Discharge status: Home / Self Care | Payer: BLUE CROSS/BLUE SHIELD | Attending: Emergency Medicine | Admitting: Emergency Medicine

## 2018-11-11 ENCOUNTER — Encounter: Payer: Self-pay | Admitting: Emergency Medicine

## 2018-11-11 ENCOUNTER — Other Ambulatory Visit: Payer: Self-pay

## 2018-11-11 DIAGNOSIS — Z20828 Contact with and (suspected) exposure to other viral communicable diseases: Secondary | ICD-10-CM | POA: Insufficient documentation

## 2018-11-11 DIAGNOSIS — R0602 Shortness of breath: Secondary | ICD-10-CM | POA: Diagnosis not present

## 2018-11-11 DIAGNOSIS — R0789 Other chest pain: Secondary | ICD-10-CM | POA: Insufficient documentation

## 2018-11-11 DIAGNOSIS — Z79899 Other long term (current) drug therapy: Secondary | ICD-10-CM | POA: Diagnosis not present

## 2018-11-11 DIAGNOSIS — R079 Chest pain, unspecified: Secondary | ICD-10-CM

## 2018-11-11 LAB — BASIC METABOLIC PANEL
Anion gap: 8 (ref 5–15)
BUN: 9 mg/dL (ref 6–20)
CO2: 24 mmol/L (ref 22–32)
Calcium: 9.2 mg/dL (ref 8.9–10.3)
Chloride: 107 mmol/L (ref 98–111)
Creatinine, Ser: 0.51 mg/dL (ref 0.44–1.00)
GFR calc Af Amer: >60 mL/min (ref >60)
GFR calc non Af Amer: >60 mL/min (ref >60)
Glucose, Bld: 127 mg/dL — ABNORMAL HIGH (ref 70–99)
Potassium: 3.4 mmol/L — ABNORMAL LOW (ref 3.5–5.1)
Sodium: 139 mmol/L (ref 135–145)

## 2018-11-11 LAB — CBC
HCT: 40.6 % (ref 36.0–46.0)
Hemoglobin: 14.4 g/dL (ref 12.0–15.0)
MCH: 31.6 pg (ref 26.0–34.0)
MCHC: 35.5 g/dL (ref 30.0–36.0)
MCV: 89.2 fL (ref 80.0–100.0)
Platelets: 199 10*3/uL (ref 150–400)
RBC: 4.55 MIL/uL (ref 3.87–5.11)
RDW: 11.9 % (ref 11.5–15.5)
WBC: 4.7 10*3/uL (ref 4.0–10.5)
nRBC: 0 % (ref 0.0–0.2)

## 2018-11-11 LAB — TROPONIN I: Troponin I: <0.03 ng/mL (ref <0.03)

## 2018-11-11 MED ORDER — SODIUM CHLORIDE 0.9% FLUSH: 3.0000 mL | Freq: Once | INTRAVENOUS | Status: DC

## 2018-11-11 NOTE — ED Provider Notes (Signed)
[facility] Emergency Department Provider Note  ____________________________________________   None    (approximate)  I have reviewed the triage vital signs and the nursing notes.   HISTORY  Chief Complaint Shortness of Breath; Weakness; and Chest Pain    HPI Grace Burnett is a 53 y.o. female presents emergency department complaining of chest pain or shortness of breath last night.  She has had several episodes over the past week.  She denies any fever or chills.  She does have an infected tooth that she is currently taking amoxicillin for.  She is not had a rash or hives that she does not feel like the pain is related to the antibiotic.  She is a non-smoker.  She does not take any hormone replacement therapy.    Past Medical History:  Diagnosis Date   Abnormal liver diagnostic imaging    Arthritis    Cancer ([place])    skin   Congenital cataract    Depression    Gastritis    Glaucoma (increased eye pressure)    LBBB (left bundle branch block)     Patient Active Problem List   Diagnosis Date Noted   Bloody discharge from nipple 01/29/2015   Arthritis 10/23/2014   Cataract, congenital 10/23/2014   Glaucoma 10/23/2014   Nonvisualization of gallbladder 03/27/2014   Block, bundle branch, left 03/04/2014   Clinical depression 10/23/2013   Mood altered 09/17/2012    Past Surgical History:  Procedure Laterality Date   COLONOSCOPY  2015   Dr Rayann Heman   ESOPHAGOGASTRODUODENOSCOPY N/A 10/27/2014   Procedure: ESOPHAGOGASTRODUODENOSCOPY (EGD);  Surgeon: Josefine Class, MD;  Location: [facility] ENDOSCOPY;  Service: Endoscopy;  Laterality: N/A;    Prior to Admission medications   Medication Sig Start Date End Date Taking? Authorizing Provider  dicyclomine (BENTYL) 10 MG capsule Take 10 mg by mouth 3 (three) times daily as needed for spasms.    [provider]  ibuprofen (ADVIL,MOTRIN) 200 MG tablet Take 400 mg by mouth daily as  needed for headache (pain).    [provider]  latanoprost (XALATAN) 0.005 % ophthalmic solution Place 1 drop into both eyes at bedtime.  10/28/13   [provider]  ondansetron (ZOFRAN) 4 MG tablet Take 1 tablet (4 mg total) by mouth every 8 (eight) hours as needed for nausea or vomiting. 04/25/16   Daymon Larsen, MD  rOPINIRole (REQUIP) 0.25 MG tablet Take 1 tablet by mouth at bedtime. Take 1-2 tablets by mouth 1 to 3 hours before bedtime as needed 02/18/16   [provider]  timolol (TIMOPTIC) 0.5 % ophthalmic solution Place 1 drop into both eyes daily. 02/03/15   [provider]    Allergies Sulfa antibiotics  Family History  Problem Relation Age of Onset   Breast cancer Other        great aunt age 35-40?    Social History Social History   Tobacco Use   Smoking status: Never Smoker   Smokeless tobacco: Never Used  Substance Use Topics   Alcohol use: No    Alcohol/week: 0.0 standard drinks   Drug use: No    Review of Systems  Constitutional: No fever/chills Eyes: No visual changes. ENT: No sore throat. Respiratory: Positive cough Cardiovascular: Complains of chest pressure Genitourinary: Negative for dysuria. Musculoskeletal: Negative for back pain. Skin: Negative for rash.    ____________________________________________   PHYSICAL EXAM:  VITAL SIGNS: ED Triage Vitals  Enc Vitals Group     BP  11/11/18 1229 (!) 115/93     Pulse Rate 11/11/18 1227 63     Resp 11/11/18 1222 12     Temp 11/11/18 1227 98.2 F (36.8 C)     Temp Source 11/11/18 1227 Oral     SpO2 11/11/18 1227 100 %     Weight 11/11/18 1223 135 lb (61.2 kg)     Height 11/11/18 1223 5' (1.524 m)     Head Circumference --      Peak Flow --      Pain Score 11/11/18 1223 6     Pain Loc --      Pain Edu? --      Excl. in [place]? --     Constitutional: Alert and oriented. Well appearing and in no acute distress. Eyes: Conjunctivae are normal.  Head:  Atraumatic. Nose: No congestion/rhinnorhea. Mouth/Throat: Mucous membranes are moist.   Neck:  supple no lymphadenopathy noted Cardiovascular: Normal rate, regular rhythm. Heart sounds are normal Respiratory: Normal respiratory effort.  No retractions, lungs c t a  GU: deferred Musculoskeletal: FROM all extremities, warm and well perfused Neurologic:  Normal speech and language.  Skin:  Skin is warm, dry and intact. No rash noted. Psychiatric: Mood and affect are normal. Speech and behavior are normal.  ____________________________________________   LABS (all labs ordered are listed, but only abnormal results are displayed)  Labs Reviewed  BASIC METABOLIC PANEL - Abnormal; Notable for the following components:      Result Value   Potassium 3.4 (*)    Glucose, Bld 127 (*)    All other components within normal limits  NOVEL CORONAVIRUS, NAA (HOSPITAL ORDER, SEND-OUT TO REF LAB)  CBC  TROPONIN I   ____________________________________________   ____________________________________________  RADIOLOGY  Chest x-ray is normal  ____________________________________________   PROCEDURES  Procedure(s) performed: No  Procedures    ____________________________________________   INITIAL IMPRESSION / ASSESSMENT AND PLAN / ED COURSE  Pertinent labs & imaging results that were available during my care of the patient were reviewed by me and considered in my medical decision making (see chart for details).   Patient is a 54 year old female presents emergency department complaining of chest pain shortness of breath.  She had one episode last night and had several over the past week.  She denies any cough or difficulty breathing at this time.  However she is concerned that she is having a abscessed tooth pulled this week.  She is concerned that she might have coronavirus.  Physical exam patient appears very well.  Entire exam is unremarkable.  CBC is normal, troponin is normal,  basic metabolic panel is normal, COVID test is pending  Chest x-ray is normal EKG showed normal sinus rhythm  Explained all of the test results to the patient.  She was instructed to remain home until her COVID test returns is negative.  If it returns is positive she should cancel her dental appointment and follow-up with health department.  She states she understands will comply.  If she has worsening chest pain she should return to emergency department.  He was discharged stable condition.    Grace Burnett was evaluated in Emergency Department on 11/11/2018 for the symptoms described in the history of present illness. She was evaluated in the context of the global COVID-19 pandemic, which necessitated consideration that the patient might be at risk for infection with the SARS-CoV-2 virus that causes COVID-19. Institutional protocols and algorithms that pertain to the evaluation of patients at risk for  COVID-19 are in a state of rapid change based on information released by regulatory bodies including the CDC and federal and state organizations. These policies and algorithms were followed during the patient's care in the ED.   As part of my medical decision making, I reviewed the following data within the [place] notes reviewed and incorporated, Labs reviewed see above, EKG interpreted NSR, Old chart reviewed, Radiograph reviewed chest x-ray, Notes from prior ED visits and [place] Controlled Substance Database  ____________________________________________   FINAL CLINICAL IMPRESSION(S) / ED DIAGNOSES  Final diagnoses:  Nonspecific chest pain      NEW MEDICATIONS STARTED DURING THIS VISIT:  Discharge Medication List as of 11/11/2018  2:51 PM       Note:  This document was prepared using Dragon voice recognition software and may include unintentional dictation errors.    Versie Starks, PA-C 11/11/18 1730    Arta Silence, MD 11/12/18 [phone]

## 2018-11-11 NOTE — Discharge Instructions (Addendum)
Follow-up with your regular doctor if not improving in 2 to 3 days.  Return emergency department worsening.  Continue your medications.  You should remain at home until your COVID test is resulted.  If your test is negative you may return to work and will have your dental work done.  If it is positive you must cancel your dental appointment and remain out of work for 7 to 10 days.  The health department would contact you.

## 2018-11-11 NOTE — ED Triage Notes (Signed)
Pt to ED via POV from home for shortness of breath since last night. Pt also c/o generalized weakness and chest pressure. Pt states that the chest pressure in the central chest. Pt states that the pressure radiates into her neck and down below her breast. Pt denies fever. Pt is able to talk in complete sentences at this time.

## 2018-11-14 LAB — NOVEL CORONAVIRUS, NAA (HOSP ORDER, SEND-OUT TO REF LAB; TAT 18-24 HRS): SARS-CoV-2, NAA: NOT DETECTED

## 2019-03-18 ENCOUNTER — Other Ambulatory Visit: Payer: Self-pay | Admitting: Internal Medicine

## 2019-03-18 DIAGNOSIS — Z1231 Encounter for screening mammogram for malignant neoplasm of breast: Secondary | ICD-10-CM

## 2019-03-28 ENCOUNTER — Ambulatory Visit
Admission: RE | Admit: 2019-03-28 | Discharge: 2019-03-28 | Disposition: A | Discharge status: Home / Self Care | Payer: BLUE CROSS/BLUE SHIELD | Source: Ambulatory Visit | Attending: Internal Medicine | Admitting: Internal Medicine

## 2019-03-28 ENCOUNTER — Other Ambulatory Visit: Payer: Self-pay

## 2019-03-28 DIAGNOSIS — Z1231 Encounter for screening mammogram for malignant neoplasm of breast: Secondary | ICD-10-CM | POA: Diagnosis not present

## 2019-04-24 ENCOUNTER — Other Ambulatory Visit: Payer: Self-pay | Admitting: Student

## 2019-04-24 DIAGNOSIS — Z8371 Family history of colonic polyps: Secondary | ICD-10-CM

## 2019-05-28 ENCOUNTER — Other Ambulatory Visit: Payer: Self-pay | Admitting: Student

## 2019-05-28 DIAGNOSIS — R109 Unspecified abdominal pain: Secondary | ICD-10-CM

## 2019-05-28 DIAGNOSIS — R1011 Right upper quadrant pain: Secondary | ICD-10-CM

## 2019-05-29 ENCOUNTER — Ambulatory Visit
Admission: RE | Admit: 2019-05-29 | Discharge: 2019-05-29 | Disposition: A | Discharge status: Home / Self Care | Payer: BLUE CROSS/BLUE SHIELD | Source: Ambulatory Visit | Attending: Student | Admitting: Student

## 2019-05-29 ENCOUNTER — Other Ambulatory Visit: Payer: Self-pay | Admitting: Student

## 2019-05-29 ENCOUNTER — Other Ambulatory Visit: Payer: Self-pay

## 2019-05-29 ENCOUNTER — Encounter
Admission: RE | Admit: 2019-05-29 | Discharge: 2019-05-29 | Disposition: A | Discharge status: Home / Self Care | Payer: BLUE CROSS/BLUE SHIELD | Source: Ambulatory Visit | Attending: Student | Admitting: Student

## 2019-05-29 DIAGNOSIS — R1011 Right upper quadrant pain: Secondary | ICD-10-CM | POA: Diagnosis present

## 2019-05-29 DIAGNOSIS — R109 Unspecified abdominal pain: Secondary | ICD-10-CM | POA: Diagnosis present

## 2019-05-29 DIAGNOSIS — Z8371 Family history of colonic polyps: Secondary | ICD-10-CM | POA: Insufficient documentation

## 2019-05-29 DIAGNOSIS — N2889 Other specified disorders of kidney and ureter: Secondary | ICD-10-CM

## 2019-05-29 DIAGNOSIS — G8929 Other chronic pain: Secondary | ICD-10-CM | POA: Insufficient documentation

## 2019-05-29 MED ORDER — TECHNETIUM TC 99M MEBROFENIN IV KIT
5.0000 | PACK | Freq: Once | INTRAVENOUS | Status: AC | PRN
  Administered 2019-05-29: 5.542 via INTRAVENOUS

## 2019-06-11 ENCOUNTER — Other Ambulatory Visit: Payer: Self-pay

## 2019-06-11 ENCOUNTER — Ambulatory Visit
Admission: RE | Admit: 2019-06-11 | Discharge: 2019-06-11 | Disposition: A | Discharge status: Home / Self Care | Payer: BLUE CROSS/BLUE SHIELD | Source: Ambulatory Visit | Attending: Student | Admitting: Student

## 2019-06-11 DIAGNOSIS — N2889 Other specified disorders of kidney and ureter: Secondary | ICD-10-CM | POA: Diagnosis not present

## 2019-06-11 LAB — POCT I-STAT CREATININE: Creatinine, Ser: 0.6 mg/dL (ref 0.44–1.00)

## 2019-06-11 MED ORDER — GADOBUTROL 1 MMOL/ML IV SOLN
6.0000 mL | Freq: Once | INTRAVENOUS | Status: AC | PRN
  Administered 2019-06-11: 10:00:00 6 mL via INTRAVENOUS

## 2019-09-06 ENCOUNTER — Other Ambulatory Visit: Payer: Self-pay | Admitting: Internal Medicine

## 2019-09-06 DIAGNOSIS — N644 Mastodynia: Secondary | ICD-10-CM

## 2019-09-06 DIAGNOSIS — R079 Chest pain, unspecified: Secondary | ICD-10-CM

## 2019-09-12 ENCOUNTER — Other Ambulatory Visit: Payer: Self-pay | Admitting: Internal Medicine

## 2019-09-12 DIAGNOSIS — R079 Chest pain, unspecified: Secondary | ICD-10-CM

## 2019-09-12 DIAGNOSIS — N644 Mastodynia: Secondary | ICD-10-CM

## 2019-09-20 ENCOUNTER — Ambulatory Visit
Admission: RE | Admit: 2019-09-20 | Discharge: 2019-09-20 | Disposition: A | Discharge status: Home / Self Care | Payer: BLUE CROSS/BLUE SHIELD | Source: Ambulatory Visit | Attending: Internal Medicine | Admitting: Internal Medicine

## 2019-09-20 DIAGNOSIS — N644 Mastodynia: Secondary | ICD-10-CM | POA: Diagnosis not present

## 2019-09-20 DIAGNOSIS — R079 Chest pain, unspecified: Secondary | ICD-10-CM

## 2020-03-19 ENCOUNTER — Other Ambulatory Visit: Payer: Self-pay | Admitting: Internal Medicine

## 2020-04-21 ENCOUNTER — Other Ambulatory Visit: Payer: Self-pay | Admitting: Obstetrics and Gynecology

## 2020-04-21 DIAGNOSIS — N644 Mastodynia: Secondary | ICD-10-CM

## 2020-05-01 ENCOUNTER — Other Ambulatory Visit: Payer: Self-pay

## 2020-05-01 ENCOUNTER — Ambulatory Visit
Admission: RE | Admit: 2020-05-01 | Discharge: 2020-05-01 | Disposition: A | Discharge status: Home / Self Care | Payer: BLUE CROSS/BLUE SHIELD | Source: Ambulatory Visit | Attending: Obstetrics and Gynecology | Admitting: Obstetrics and Gynecology

## 2020-05-01 DIAGNOSIS — N644 Mastodynia: Secondary | ICD-10-CM | POA: Insufficient documentation

## 2021-01-25 ENCOUNTER — Other Ambulatory Visit: Payer: Self-pay | Admitting: Student

## 2021-01-25 DIAGNOSIS — M899 Disorder of bone, unspecified: Secondary | ICD-10-CM

## 2021-01-25 DIAGNOSIS — M7582 Other shoulder lesions, left shoulder: Secondary | ICD-10-CM

## 2021-01-25 DIAGNOSIS — S43432A Superior glenoid labrum lesion of left shoulder, initial encounter: Secondary | ICD-10-CM

## 2021-01-25 DIAGNOSIS — M7542 Impingement syndrome of left shoulder: Secondary | ICD-10-CM

## 2021-01-25 DIAGNOSIS — M25512 Pain in left shoulder: Secondary | ICD-10-CM

## 2021-01-25 DIAGNOSIS — G8929 Other chronic pain: Secondary | ICD-10-CM

## 2021-02-08 ENCOUNTER — Ambulatory Visit
Admission: RE | Admit: 2021-02-08 | Discharge: 2021-02-08 | Disposition: A | Discharge status: Home / Self Care | Payer: 59 | Source: Ambulatory Visit | Attending: Student | Admitting: Student

## 2021-02-08 ENCOUNTER — Other Ambulatory Visit: Payer: Self-pay

## 2021-02-08 DIAGNOSIS — G8929 Other chronic pain: Secondary | ICD-10-CM | POA: Diagnosis present

## 2021-02-08 DIAGNOSIS — S43432A Superior glenoid labrum lesion of left shoulder, initial encounter: Secondary | ICD-10-CM | POA: Diagnosis not present

## 2021-02-08 DIAGNOSIS — M899 Disorder of bone, unspecified: Secondary | ICD-10-CM | POA: Insufficient documentation

## 2021-02-08 DIAGNOSIS — M7542 Impingement syndrome of left shoulder: Secondary | ICD-10-CM

## 2021-02-08 DIAGNOSIS — M7582 Other shoulder lesions, left shoulder: Secondary | ICD-10-CM

## 2021-02-08 DIAGNOSIS — X58XXXA Exposure to other specified factors, initial encounter: Secondary | ICD-10-CM | POA: Diagnosis not present

## 2021-02-08 DIAGNOSIS — M25512 Pain in left shoulder: Secondary | ICD-10-CM | POA: Insufficient documentation

## 2021-02-08 MED ORDER — SODIUM CHLORIDE (PF) 0.9 % IJ SOLN
7.0000 mL | Freq: Once | INTRAMUSCULAR | Status: AC
  Administered 2021-02-08: 7 mL

## 2021-02-08 MED ORDER — GADOBUTROL 1 MMOL/ML IV SOLN
0.0500 mL | Freq: Once | INTRAVENOUS | Status: AC | PRN
  Administered 2021-02-08: 0.05 mL

## 2021-02-08 MED ORDER — IOHEXOL 180 MG/ML  SOLN
10.0000 mL | Freq: Once | INTRAMUSCULAR | Status: AC | PRN
  Administered 2021-02-08: 10 mL via INTRA_ARTICULAR

## 2021-02-08 MED ORDER — LIDOCAINE HCL (PF) 1 % IJ SOLN
5.0000 mL | Freq: Once | INTRAMUSCULAR | Status: AC
  Administered 2021-02-08: 5 mL
  Filled 2021-02-08: qty 5

## 2022-06-29 DIAGNOSIS — I5020 Unspecified systolic (congestive) heart failure: Secondary | ICD-10-CM | POA: Diagnosis not present

## 2022-06-29 DIAGNOSIS — R079 Chest pain, unspecified: Secondary | ICD-10-CM | POA: Diagnosis not present

## 2022-06-29 DIAGNOSIS — I25118 Atherosclerotic heart disease of native coronary artery with other forms of angina pectoris: Secondary | ICD-10-CM | POA: Diagnosis not present

## 2022-08-04 ENCOUNTER — Telehealth: Payer: Self-pay

## 2022-08-04 NOTE — Telephone Encounter (Signed)
Mychart msg sent. AS, CMA

## 2022-08-23 DIAGNOSIS — Z79899 Other long term (current) drug therapy: Secondary | ICD-10-CM | POA: Diagnosis not present

## 2022-08-23 DIAGNOSIS — I5020 Unspecified systolic (congestive) heart failure: Secondary | ICD-10-CM | POA: Diagnosis not present

## 2022-08-23 DIAGNOSIS — I5022 Chronic systolic (congestive) heart failure: Secondary | ICD-10-CM | POA: Diagnosis not present

## 2022-08-23 DIAGNOSIS — Z812 Family history of tobacco abuse and dependence: Secondary | ICD-10-CM | POA: Diagnosis not present

## 2022-08-23 DIAGNOSIS — Z8249 Family history of ischemic heart disease and other diseases of the circulatory system: Secondary | ICD-10-CM | POA: Diagnosis not present

## 2022-08-23 DIAGNOSIS — K7400 Hepatic fibrosis, unspecified: Secondary | ICD-10-CM | POA: Diagnosis not present

## 2022-08-23 DIAGNOSIS — K769 Liver disease, unspecified: Secondary | ICD-10-CM | POA: Diagnosis not present

## 2022-08-23 DIAGNOSIS — K7469 Other cirrhosis of liver: Secondary | ICD-10-CM | POA: Diagnosis not present

## 2022-08-23 DIAGNOSIS — I447 Left bundle-branch block, unspecified: Secondary | ICD-10-CM | POA: Diagnosis not present

## 2022-08-23 DIAGNOSIS — I2510 Atherosclerotic heart disease of native coronary artery without angina pectoris: Secondary | ICD-10-CM | POA: Diagnosis not present

## 2022-08-23 DIAGNOSIS — Z7982 Long term (current) use of aspirin: Secondary | ICD-10-CM | POA: Diagnosis not present

## 2022-08-23 DIAGNOSIS — I071 Rheumatic tricuspid insufficiency: Secondary | ICD-10-CM | POA: Diagnosis not present

## 2022-08-23 DIAGNOSIS — Z882 Allergy status to sulfonamides status: Secondary | ICD-10-CM | POA: Diagnosis not present

## 2022-08-23 DIAGNOSIS — F32A Depression, unspecified: Secondary | ICD-10-CM | POA: Diagnosis not present

## 2022-08-23 DIAGNOSIS — Z87891 Personal history of nicotine dependence: Secondary | ICD-10-CM | POA: Diagnosis not present

## 2022-08-23 DIAGNOSIS — I420 Dilated cardiomyopathy: Secondary | ICD-10-CM | POA: Diagnosis not present

## 2022-08-23 DIAGNOSIS — F419 Anxiety disorder, unspecified: Secondary | ICD-10-CM | POA: Diagnosis not present

## 2022-08-23 DIAGNOSIS — Z9049 Acquired absence of other specified parts of digestive tract: Secondary | ICD-10-CM | POA: Diagnosis not present

## 2023-03-13 DIAGNOSIS — I5020 Unspecified systolic (congestive) heart failure: Secondary | ICD-10-CM | POA: Diagnosis not present

## 2023-03-13 DIAGNOSIS — I2510 Atherosclerotic heart disease of native coronary artery without angina pectoris: Secondary | ICD-10-CM | POA: Diagnosis not present

## 2023-03-13 DIAGNOSIS — I420 Dilated cardiomyopathy: Secondary | ICD-10-CM | POA: Diagnosis not present

## 2023-03-13 DIAGNOSIS — E782 Mixed hyperlipidemia: Secondary | ICD-10-CM | POA: Diagnosis not present

## 2023-09-11 DIAGNOSIS — I25118 Atherosclerotic heart disease of native coronary artery with other forms of angina pectoris: Secondary | ICD-10-CM | POA: Diagnosis not present

## 2023-09-11 DIAGNOSIS — I5020 Unspecified systolic (congestive) heart failure: Secondary | ICD-10-CM | POA: Diagnosis not present

## 2023-09-11 DIAGNOSIS — K769 Liver disease, unspecified: Secondary | ICD-10-CM | POA: Diagnosis not present

## 2023-09-11 DIAGNOSIS — I447 Left bundle-branch block, unspecified: Secondary | ICD-10-CM | POA: Diagnosis not present

## 2023-09-11 DIAGNOSIS — K7469 Other cirrhosis of liver: Secondary | ICD-10-CM | POA: Diagnosis not present

## 2023-09-11 DIAGNOSIS — E782 Mixed hyperlipidemia: Secondary | ICD-10-CM | POA: Diagnosis not present

## 2023-09-11 DIAGNOSIS — I2510 Atherosclerotic heart disease of native coronary artery without angina pectoris: Secondary | ICD-10-CM | POA: Diagnosis not present

## 2023-11-17 DIAGNOSIS — N3001 Acute cystitis with hematuria: Secondary | ICD-10-CM | POA: Diagnosis not present
